# Patient Record
Sex: Female | Born: 1980
Health system: Southern US, Community
[De-identification: ages and names within clinical notes are randomized; demographics above are authoritative.]

## PROBLEM LIST (undated history)

## (undated) DIAGNOSIS — K5090 Crohn's disease, unspecified, without complications: Secondary | ICD-10-CM

## (undated) HISTORY — PX: COLPOSCOPY: SHX161

## (undated) HISTORY — PX: RIGHT OOPHORECTOMY: SHX2359

## (undated) HISTORY — PX: REVISION OF TOTAL SHOULDER: SUR1277

## (undated) HISTORY — PX: TONSILLECTOMY: SUR1361

## (undated) HISTORY — PX: SHOULDER ARTHROSCOPY: SHX128

## (undated) HISTORY — PX: TUBAL LIGATION: SHX77

## (undated) HISTORY — PX: NASAL SEPTUM SURGERY: SHX37

---

## 2015-07-15 DIAGNOSIS — J0120 Acute ethmoidal sinusitis, unspecified: Secondary | ICD-10-CM | POA: Diagnosis not present

## 2015-08-17 ENCOUNTER — Emergency Department (HOSPITAL_COMMUNITY): Payer: Medicare Other

## 2015-08-17 ENCOUNTER — Encounter (HOSPITAL_COMMUNITY): Payer: Self-pay | Admitting: *Deleted

## 2015-08-17 ENCOUNTER — Emergency Department (HOSPITAL_COMMUNITY)
Admission: EM | Admit: 2015-08-17 | Discharge: 2015-08-17 | Disposition: A | Discharge status: Home / Self Care | Payer: Medicare Other | Attending: Emergency Medicine | Admitting: Emergency Medicine

## 2015-08-17 DIAGNOSIS — Y9389 Activity, other specified: Secondary | ICD-10-CM | POA: Diagnosis not present

## 2015-08-17 DIAGNOSIS — M79671 Pain in right foot: Secondary | ICD-10-CM | POA: Diagnosis not present

## 2015-08-17 DIAGNOSIS — S99921A Unspecified injury of right foot, initial encounter: Secondary | ICD-10-CM | POA: Diagnosis not present

## 2015-08-17 DIAGNOSIS — Y998 Other external cause status: Secondary | ICD-10-CM | POA: Diagnosis not present

## 2015-08-17 DIAGNOSIS — Z880 Allergy status to penicillin: Secondary | ICD-10-CM | POA: Diagnosis not present

## 2015-08-17 DIAGNOSIS — Z8719 Personal history of other diseases of the digestive system: Secondary | ICD-10-CM | POA: Diagnosis not present

## 2015-08-17 DIAGNOSIS — W208XXA Other cause of strike by thrown, projected or falling object, initial encounter: Secondary | ICD-10-CM | POA: Insufficient documentation

## 2015-08-17 DIAGNOSIS — Y9289 Other specified places as the place of occurrence of the external cause: Secondary | ICD-10-CM | POA: Insufficient documentation

## 2015-08-17 DIAGNOSIS — M79674 Pain in right toe(s): Secondary | ICD-10-CM | POA: Diagnosis not present

## 2015-08-17 DIAGNOSIS — K5090 Crohn's disease, unspecified, without complications: Secondary | ICD-10-CM | POA: Insufficient documentation

## 2015-08-17 HISTORY — DX: Crohn's disease, unspecified, without complications: K50.90

## 2015-08-17 MED ORDER — TRAMADOL HCL 50 MG PO TABS
100.0000 mg | ORAL_TABLET | Freq: Once | ORAL | Status: AC
  Administered 2015-08-17: 100 mg via ORAL
  Filled 2015-08-17: qty 2

## 2015-08-17 MED ORDER — NAPROXEN 500 MG PO TABS: 500.0000 mg | ORAL_TABLET | Freq: Two times a day (BID) | ORAL | Status: DC

## 2015-08-17 NOTE — ED Notes (Signed)
Pt placed into shorts 

## 2015-08-17 NOTE — ED Notes (Signed)
Pt reports dropping a 20lbs weight on RT foot on Friday AM..  Pt reports pain to rt foot while walking and standing. Foot is red at base of Rt great toe.

## 2015-08-17 NOTE — ED Provider Notes (Signed)
CSN: [id number]     Arrival date & time 08/17/15  1648 History   By signing my name below, I, Evon Slack, attest that this documentation has been prepared under the direction and in the presence of [employer], PA-C. Electronically Signed: Evon Slack, ED Scribe. 08/17/2015. 5:39 PM.     Chief Complaint  Patient presents with  . Foot Pain   The history is provided by the patient. No language interpreter was used.   HPI Comments: Chelsea Barnes is a 35 y.o. female who presents to the Emergency Department complaining of right foot injury onset 1 day prior. Pt states that she dropped a 20 pound weight on the top of her foot. She states that she has some associated swelling. Pt states that the pain is worse when bearing weight and ambulating. Pt states that she was wearing "flats" when the wight dropped on her foot. Pt states that she tried tylenol with no relief. Pt denies numbness or tingling. Denies that she is pregnant or breast feeding.  Past Medical History  Diagnosis Date  . Crohn disease [facility])    Past Surgical History  Procedure Laterality Date  . Nasal septum surgery    . Shoulder arthroscopy    . Revision of total shoulder Right   . Tonsillectomy    . Colposcopy      with laser  . Cesarean section    . Tubal ligation    . Right oophorectomy     History reviewed. No pertinent family history. Social History  Substance Use Topics  . Smoking status: Never Smoker   . Smokeless tobacco: Never Used  . Alcohol Use: No   OB History    No data available     Review of Systems  Musculoskeletal: Positive for joint swelling and arthralgias. Negative for gait problem.  Neurological: Negative for numbness.     Allergies  Penicillins  Home Medications   Prior to Admission medications   Medication Sig Start Date End Date Taking? Authorizing Provider  naproxen (NAPROSYN) 500 MG tablet Take 1 tablet (500 mg total) by mouth 2 (two) times daily. 08/17/15   Raekwan Spelman  Patel-Mills, PA-C   BP 110/66 mmHg  Pulse 67  Temp(Src) 97.8 F (36.6 C) (Oral)  Resp 18  Ht  (1.753 m)  Wt 122.925 kg  BMI 40.00 kg/m2  SpO2 98%  LMP 08/14/2015   Physical Exam  Constitutional: She is oriented to person, place, and time. She appears well-developed and well-nourished. No distress.  HENT:  Head: Normocephalic and atraumatic.  Eyes: Conjunctivae and EOM are normal.  Neck: Neck supple. No tracheal deviation present.  Cardiovascular: Normal rate.   Pulmonary/Chest: Effort normal. No respiratory distress.  Musculoskeletal: Normal range of motion.  Right foot: Able to flex and extend toes. 2+ DP pulse. Tenderness to palpation of the lateral and dorsal aspect of the distal phalanx. No erythema or ecchymosis.  Neurological: She is alert and oriented to person, place, and time.  Skin: Skin is warm and dry.  Psychiatric: She has a normal mood and affect. Her behavior is normal.  Nursing note and vitals reviewed.   ED Course  Procedures (including critical care time) DIAGNOSTIC STUDIES: Oxygen Saturation is 99% on RA, normal by my interpretation.    COORDINATION OF CARE: 5:39 PM-Discussed treatment plan with pt at bedside and pt agreed to plan.     Labs Review Labs Reviewed - No data to display  Imaging Review Dg Toe Great Right  08/17/2015  CLINICAL DATA:  Patient with great toe pain after dropping weight on the foot. Initial encounter. EXAM: RIGHT GREAT TOE COMPARISON:  None. FINDINGS: There is no evidence of fracture or dislocation. There is no evidence of arthropathy or other focal bone abnormality. Soft tissues are unremarkable. IMPRESSION: Negative. Electronically Signed   By: Annia Belt M.D.   On: 08/17/2015 18:32      EKG Interpretation None      MDM   Final diagnoses:  Right foot pain   Patient presents for first right toe pain after dropping a 20 pound weight on it yesterday after working out. She has mild tenderness along the lateral and  worse last bite of the toe. No obvious signs of swelling or ecchymosis. X-ray is negative for acute fracture. I discussed applying ice. I also explained that she should wear proper shoes. She was given naproxen for pain. Follow-up was also discussed within 1 week if pain persists. Patient agrees with plan. Filed Vitals:   08/17/15 1704 08/17/15 1848  BP: 121/61 110/66  Pulse: 64 67  Temp: 97.7 F (36.5 C) 97.8 F (36.6 C)  Resp: 17 18   Medications  traMADol (ULTRAM) tablet 100 mg (100 mg Oral Given 08/17/15 1826)   I personally performed the services described in this documentation, which was scribed in my presence. The recorded information has been reviewed and is accurate.      Catha Gosselin, PA-C 08/17/15 1919  Gwyneth Sprout, MD 08/19/15 [phone]

## 2015-11-11 DIAGNOSIS — R5383 Other fatigue: Secondary | ICD-10-CM | POA: Diagnosis not present

## 2015-11-11 DIAGNOSIS — L659 Nonscarring hair loss, unspecified: Secondary | ICD-10-CM | POA: Diagnosis not present

## 2015-11-11 DIAGNOSIS — M2550 Pain in unspecified joint: Secondary | ICD-10-CM | POA: Diagnosis not present

## 2015-11-11 DIAGNOSIS — K5090 Crohn's disease, unspecified, without complications: Secondary | ICD-10-CM | POA: Diagnosis not present

## 2015-12-11 DIAGNOSIS — K529 Noninfective gastroenteritis and colitis, unspecified: Secondary | ICD-10-CM | POA: Diagnosis not present

## 2015-12-16 DIAGNOSIS — M2550 Pain in unspecified joint: Secondary | ICD-10-CM | POA: Diagnosis not present

## 2015-12-16 DIAGNOSIS — Z8269 Family history of other diseases of the musculoskeletal system and connective tissue: Secondary | ICD-10-CM | POA: Diagnosis not present

## 2015-12-16 DIAGNOSIS — M533 Sacrococcygeal disorders, not elsewhere classified: Secondary | ICD-10-CM | POA: Diagnosis not present

## 2015-12-16 DIAGNOSIS — R5382 Chronic fatigue, unspecified: Secondary | ICD-10-CM | POA: Diagnosis not present

## 2015-12-16 DIAGNOSIS — K5010 Crohn's disease of large intestine without complications: Secondary | ICD-10-CM | POA: Diagnosis not present

## 2015-12-25 DIAGNOSIS — K529 Noninfective gastroenteritis and colitis, unspecified: Secondary | ICD-10-CM | POA: Diagnosis not present

## 2015-12-25 DIAGNOSIS — R12 Heartburn: Secondary | ICD-10-CM | POA: Diagnosis not present

## 2016-03-10 DIAGNOSIS — N898 Other specified noninflammatory disorders of vagina: Secondary | ICD-10-CM | POA: Diagnosis not present

## 2016-03-10 DIAGNOSIS — N760 Acute vaginitis: Secondary | ICD-10-CM | POA: Diagnosis not present

## 2016-10-27 DIAGNOSIS — R509 Fever, unspecified: Secondary | ICD-10-CM | POA: Diagnosis not present

## 2016-11-11 DIAGNOSIS — R591 Generalized enlarged lymph nodes: Secondary | ICD-10-CM | POA: Diagnosis not present

## 2016-11-12 ENCOUNTER — Other Ambulatory Visit: Payer: Self-pay | Admitting: Family Medicine

## 2016-11-13 ENCOUNTER — Other Ambulatory Visit: Payer: Self-pay | Admitting: Family Medicine

## 2016-11-13 DIAGNOSIS — R591 Generalized enlarged lymph nodes: Secondary | ICD-10-CM

## 2016-11-17 ENCOUNTER — Ambulatory Visit
Admission: RE | Admit: 2016-11-17 | Discharge: 2016-11-17 | Disposition: A | Discharge status: Home / Self Care | Payer: Medicare Other | Source: Ambulatory Visit | Attending: Family Medicine | Admitting: Family Medicine

## 2016-11-17 DIAGNOSIS — R591 Generalized enlarged lymph nodes: Secondary | ICD-10-CM

## 2016-11-17 DIAGNOSIS — R599 Enlarged lymph nodes, unspecified: Secondary | ICD-10-CM | POA: Diagnosis not present

## 2016-11-24 DIAGNOSIS — D72829 Elevated white blood cell count, unspecified: Secondary | ICD-10-CM | POA: Diagnosis not present

## 2017-02-21 ENCOUNTER — Emergency Department
Admission: EM | Admit: 2017-02-21 | Discharge: 2017-02-21 | Disposition: A | Discharge status: Home / Self Care | Payer: Medicare Other | Attending: Emergency Medicine | Admitting: Emergency Medicine

## 2017-02-21 ENCOUNTER — Emergency Department: Payer: Medicare Other

## 2017-02-21 DIAGNOSIS — M79605 Pain in left leg: Secondary | ICD-10-CM | POA: Diagnosis present

## 2017-02-21 DIAGNOSIS — W2203XA Walked into furniture, initial encounter: Secondary | ICD-10-CM | POA: Diagnosis not present

## 2017-02-21 DIAGNOSIS — S8012XA Contusion of left lower leg, initial encounter: Secondary | ICD-10-CM | POA: Diagnosis not present

## 2017-02-21 DIAGNOSIS — Y9301 Activity, walking, marching and hiking: Secondary | ICD-10-CM | POA: Insufficient documentation

## 2017-02-21 DIAGNOSIS — Y929 Unspecified place or not applicable: Secondary | ICD-10-CM | POA: Diagnosis not present

## 2017-02-21 DIAGNOSIS — Y998 Other external cause status: Secondary | ICD-10-CM | POA: Insufficient documentation

## 2017-02-21 DIAGNOSIS — M79669 Pain in unspecified lower leg: Secondary | ICD-10-CM | POA: Diagnosis not present

## 2017-02-21 LAB — BASIC METABOLIC PANEL
Anion gap: 8 (ref 5–15)
BUN: 22 mg/dL — ABNORMAL HIGH (ref 6–20)
CALCIUM: 9.2 mg/dL (ref 8.9–10.3)
CO2: 26 mmol/L (ref 22–32)
Chloride: 106 mmol/L (ref 101–111)
Creatinine, Ser: 0.8 mg/dL (ref 0.44–1.00)
GFR calc non Af Amer: >60 mL/min (ref >60)
GLUCOSE: 94 mg/dL (ref 65–99)
Potassium: 4.3 mmol/L (ref 3.5–5.1)
Sodium: 140 mmol/L (ref 135–145)

## 2017-02-21 NOTE — ED Notes (Signed)
Patient transported to Ultrasound 

## 2017-02-21 NOTE — ED Triage Notes (Signed)
Pt reports that she fell in the rain hitting her left lower leg on the step in July, pt states that now the area on her left leg is tender and she has pain in her left calf and up into the back of her knee

## 2017-02-21 NOTE — ED Notes (Addendum)
Pt has left leg pain.  Pt states she fell last month and leg is not any better with pain.   No deformity noted.  No swelling noted.  Pt alert.

## 2017-02-21 NOTE — ED Provider Notes (Signed)
[facility] Emergency Department Provider Note  ____________________________________________   First MD Initiated Contact with Patient 02/21/17 1932     (approximate)  I have reviewed the triage vital signs and the nursing notes.   HISTORY  Chief Complaint Leg Pain    HPI Chelsea Barnes is a 36 y.o. female who self presents to the emergency department with 1 month of painful swelling to the anterior aspect of her left leg began shortly after striking her leg against a piece of furniture. She has noted moderate severity pain worse when touching improved when not touching. She does have a personal and family history of von Willebranddisease. She has never had a deep vein thrombus or pulmonary embolus in. She denies chest pressures of breath. She denies numbness or weakness. She denies recent travel or immobilization. She denies OCP use.   Past Medical History:  Diagnosis Date  . Crohn disease [facility])     Patient Active Problem List   Diagnosis Date Noted  . Crohn disease [facility])     Past Surgical History:  Procedure Laterality Date  . CESAREAN SECTION    . COLPOSCOPY     with laser  . NASAL SEPTUM SURGERY    . REVISION OF TOTAL SHOULDER Right   . RIGHT OOPHORECTOMY    . SHOULDER ARTHROSCOPY    . TONSILLECTOMY    . TUBAL LIGATION      Prior to Admission medications   Medication Sig Start Date End Date Taking? Authorizing Provider  naproxen (NAPROSYN) 500 MG tablet Take 1 tablet (500 mg total) by mouth 2 (two) times daily. 08/17/15   Patel-Mills, Lorelle Formosa, PA-C    Allergies Ivp dye [iodinated diagnostic agents] and Penicillins  No family history on file.  Social History Social History  Substance Use Topics  . Smoking status: Never Smoker  . Smokeless tobacco: Never Used  . Alcohol use No    Review of Systems Constitutional: No fever/chills Eyes: No visual changes. ENT: No sore throat. Cardiovascular: Denies chest pain. Respiratory:  Denies shortness of breath. Gastrointestinal: No abdominal pain.  No nausea, no vomiting.  No diarrhea.  No constipation. Genitourinary: Negative for dysuria. Musculoskeletal: Negative for back pain. Skin: Negative for rash. Neurological: Negative for headaches, focal weakness or numbness.   ____________________________________________   PHYSICAL EXAM:  VITAL SIGNS: ED Triage Vitals  Enc Vitals Group     BP 02/21/17 1748 (!) 123/55     Pulse Rate 02/21/17 1748 67     Resp 02/21/17 1748 18     Temp 02/21/17 1748 98.6 F (37 C)     Temp Source 02/21/17 1748 Oral     SpO2 02/21/17 1748 98 %     Weight 02/21/17 1749 265 lb (120.2 kg)     Height 02/21/17 1749 5\' 8"  (1.727 m)     Head Circumference --      Peak Flow --      Pain Score 02/21/17 1748 8     Pain Loc --      Pain Edu? --      Excl. in GC? --     Constitutional: Alert and oriented 4 well appearing nontoxic no diaphoresis speaks full clear sentences Eyes: PERRL EOMI. Head: Atraumatic. Nose: No congestion/rhinnorhea. Mouth/Throat: No trismus Neck: No stridor.   Cardiovascular: Normal rate, regular rhythm. Grossly normal heart sounds.  Good peripheral circulation. Respiratory: Normal respiratory effort.  No retractions. Lungs CTAB and moving good air Gastrointestinal: Obese soft nontender Musculoskeletal: No lower  extremity edema. Focal area of induration and tenderness on the anterior aspect of her left shin with no erythema or warmth to posterior cells. His pulse compartments are soft Neurologic:  Normal speech and language. No gross focal neurologic deficits are appreciated. Skin:  Skin is warm, dry and intact. No rash noted. Psychiatric: Mood and affect are normal. Speech and behavior are normal.    ____________________________________________   DIFFERENTIAL includes but not limited to  Exam thrombus, contusion, hematoma, cellulitis, abscess ____________________________________________   LABS (all labs  ordered are listed, but only abnormal results are displayed)  Labs Reviewed  BASIC METABOLIC PANEL - Abnormal; Notable for the following:       Result Value   BUN 22 (*)    All other components within normal limits    BASIC metabolic panel normal __________________________________________  EKG   ____________________________________________  RADIOLOGY  Ultrasound with no acute disease noted ____________________________________________   PROCEDURES  Procedure(s) performed: no  Procedures  Critical Care performed: no  Observation: no ____________________________________________   INITIAL IMPRESSION / ASSESSMENT AND PLAN / ED COURSE  Pertinent labs & imaging results that were available during my care of the patient were reviewed by me and considered in my medical decision making (see chart for details).  The patient arrives hemodynamically stable and well appearing. She has strong pulses and is neurovascularly intact. Fortunately her ultrasound is negative for acute DVT. She does have posttraumatic pain in the setting of von Willebrand's disease she may actually have a resolving hematoma versus a contusion. I've advised her to use nonsteroidals as well as ice and to follow up with her primary care physician. She is discharged home in good condition.      ____________________________________________   FINAL CLINICAL IMPRESSION(S) / ED DIAGNOSES  Final diagnoses:  Contusion of left lower leg, initial encounter      NEW MEDICATIONS STARTED DURING THIS VISIT:  Discharge Medication List as of 02/21/2017  9:35 PM       Note:  This document was prepared using Dragon voice recognition software and may include unintentional dictation errors.     Merrily Brittle, MD 02/22/17 1429

## 2017-02-21 NOTE — Discharge Instructions (Signed)
Fortunately today your ultrasound was negative for blood clot. Please use ibuprofen and ice as needed for discomfort and follow up with her primary care physician as needed. Return to the emergency department for any concerns.  It was a pleasure to take care of you today, and thank you for coming to our emergency department.  If you have any questions or concerns before leaving please ask the nurse to grab me and I'm more than happy to go through your aftercare instructions again.  If you were prescribed any opioid pain medication today such as Norco, Vicodin, Percocet, morphine, hydrocodone, or oxycodone please make sure you do not drive when you are taking this medication as it can alter your ability to drive safely.  If you have any concerns once you are home that you are not improving or are in fact getting worse before you can make it to your follow-up appointment, please do not hesitate to call 911 and come back for further evaluation.  Merrily Brittle, MD  Results for orders placed or performed during the hospital encounter of 02/21/17  Basic metabolic panel  Result Value Ref Range   Sodium 140 135 - 145 mmol/L   Potassium 4.3 3.5 - 5.1 mmol/L   Chloride 106 101 - 111 mmol/L   CO2 26 22 - 32 mmol/L   Glucose, Bld 94 65 - 99 mg/dL   BUN 22 (H) 6 - 20 mg/dL   Creatinine, Ser 6.96 0.44 - 1.00 mg/dL   Calcium 9.2 8.9 - 29.5 mg/dL   GFR calc non Af Amer >60 >60 mL/min   GFR calc Af Amer >60 >60 mL/min   Anion gap 8 5 - 15   US Venous Img Lower Unilateral Left  Result Date: 02/21/2017 CLINICAL DATA:  Initial evaluation for acute calf pain. EXAM: Left LOWER EXTREMITY VENOUS DOPPLER ULTRASOUND TECHNIQUE: Gray-scale sonography with graded compression, as well as color Doppler and duplex ultrasound were performed to evaluate the lower extremity deep venous systems from the level of the common femoral vein and including the common femoral, femoral, profunda femoral, popliteal and calf veins  including the posterior tibial, peroneal and gastrocnemius veins when visible. The superficial great saphenous vein was also interrogated. Spectral Doppler was utilized to evaluate flow at rest and with distal augmentation maneuvers in the common femoral, femoral and popliteal veins. COMPARISON:  None. FINDINGS: Contralateral Common Femoral Vein: Respiratory phasicity is normal and symmetric with the symptomatic side. No evidence of thrombus. Normal compressibility. Common Femoral Vein: No evidence of thrombus. Normal compressibility, respiratory phasicity and response to augmentation. Saphenofemoral Junction: No evidence of thrombus. Normal compressibility and flow on color Doppler imaging. Profunda Femoral Vein: No evidence of thrombus. Normal compressibility and flow on color Doppler imaging. Femoral Vein: No evidence of thrombus. Normal compressibility, respiratory phasicity and response to augmentation. Popliteal Vein: No evidence of thrombus. Normal compressibility, respiratory phasicity and response to augmentation. Calf Veins: No evidence of thrombus. Normal compressibility and flow on color Doppler imaging. Superficial Great Saphenous Vein: No evidence of thrombus. Normal compressibility and flow on color Doppler imaging. Venous Reflux:  None. Other Findings:  None. IMPRESSION: No evidence of DVT within the left lower extremity. Electronically Signed   By: Rise Mu M.D.   On: 02/21/2017 20:47

## 2017-02-21 NOTE — ED Notes (Signed)
D/w Dr. Roxan Hockey, new order for US of the left leg.

## 2017-02-21 NOTE — ED Notes (Signed)
FIRST NURSE NOTE:  Pt states she thinks she has a blood clot in her left leg, states she is having pain behind her knee and her foot is throbbing. Pt has possible Von Wildebrand's disease

## 2017-05-27 DIAGNOSIS — H53123 Transient visual loss, bilateral: Secondary | ICD-10-CM | POA: Diagnosis not present

## 2017-05-27 DIAGNOSIS — R600 Localized edema: Secondary | ICD-10-CM | POA: Diagnosis not present

## 2017-06-17 ENCOUNTER — Other Ambulatory Visit: Payer: Self-pay

## 2017-06-17 ENCOUNTER — Emergency Department (HOSPITAL_COMMUNITY)
Admission: EM | Admit: 2017-06-17 | Discharge: 2017-06-17 | Disposition: A | Discharge status: Home / Self Care | Payer: Medicare Other | Attending: Emergency Medicine | Admitting: Emergency Medicine

## 2017-06-17 ENCOUNTER — Encounter (HOSPITAL_COMMUNITY): Payer: Self-pay | Admitting: Nurse Practitioner

## 2017-06-17 ENCOUNTER — Emergency Department (HOSPITAL_COMMUNITY): Payer: Medicare Other

## 2017-06-17 DIAGNOSIS — H539 Unspecified visual disturbance: Secondary | ICD-10-CM

## 2017-06-17 DIAGNOSIS — H53132 Sudden visual loss, left eye: Secondary | ICD-10-CM | POA: Diagnosis present

## 2017-06-17 DIAGNOSIS — R519 Headache, unspecified: Secondary | ICD-10-CM

## 2017-06-17 DIAGNOSIS — R51 Headache: Secondary | ICD-10-CM | POA: Insufficient documentation

## 2017-06-17 DIAGNOSIS — R42 Dizziness and giddiness: Secondary | ICD-10-CM | POA: Diagnosis not present

## 2017-06-17 DIAGNOSIS — H538 Other visual disturbances: Secondary | ICD-10-CM | POA: Diagnosis not present

## 2017-06-17 LAB — CBC
HCT: 40.1 % (ref 36.0–46.0)
HEMOGLOBIN: 14.1 g/dL (ref 12.0–15.0)
MCH: 32.7 pg (ref 26.0–34.0)
MCHC: 35.2 g/dL (ref 30.0–36.0)
MCV: 93 fL (ref 78.0–100.0)
Platelets: 259 10*3/uL (ref 150–400)
RBC: 4.31 MIL/uL (ref 3.87–5.11)
RDW: 12.2 % (ref 11.5–15.5)
WBC: 4.9 10*3/uL (ref 4.0–10.5)

## 2017-06-17 LAB — URINALYSIS, ROUTINE W REFLEX MICROSCOPIC
Bilirubin Urine: NEGATIVE
GLUCOSE, UA: NEGATIVE mg/dL
Ketones, ur: NEGATIVE mg/dL
NITRITE: NEGATIVE
Protein, ur: NEGATIVE mg/dL
SPECIFIC GRAVITY, URINE: 1.025 (ref 1.005–1.030)
pH: 5 (ref 5.0–8.0)

## 2017-06-17 LAB — BASIC METABOLIC PANEL
ANION GAP: 4 — AB (ref 5–15)
BUN: 14 mg/dL (ref 6–20)
CALCIUM: 9.3 mg/dL (ref 8.9–10.3)
CO2: 27 mmol/L (ref 22–32)
CREATININE: 0.76 mg/dL (ref 0.44–1.00)
Chloride: 107 mmol/L (ref 101–111)
GFR calc Af Amer: >60 mL/min (ref >60)
GLUCOSE: 98 mg/dL (ref 65–99)
Potassium: 3.6 mmol/L (ref 3.5–5.1)
Sodium: 138 mmol/L (ref 135–145)

## 2017-06-17 LAB — TSH: TSH: 6.871 u[IU]/mL — AB (ref 0.350–4.500)

## 2017-06-17 LAB — I-STAT BETA HCG BLOOD, ED (MC, WL, AP ONLY): I-stat hCG, quantitative: <5 m[IU]/mL (ref <5)

## 2017-06-17 MED ORDER — LORAZEPAM 2 MG/ML IJ SOLN
1.0000 mg | Freq: Once | INTRAMUSCULAR | Status: AC
  Administered 2017-06-17: 1 mg via INTRAVENOUS
  Filled 2017-06-17: qty 1

## 2017-06-17 MED ORDER — PROCHLORPERAZINE EDISYLATE 5 MG/ML IJ SOLN
10.0000 mg | Freq: Once | INTRAMUSCULAR | Status: AC
  Administered 2017-06-17: 10 mg via INTRAVENOUS
  Filled 2017-06-17: qty 2

## 2017-06-17 MED ORDER — GADOBENATE DIMEGLUMINE 529 MG/ML IV SOLN
20.0000 mL | Freq: Once | INTRAVENOUS | Status: AC | PRN
  Administered 2017-06-17: 20 mL via INTRAVENOUS

## 2017-06-17 MED ORDER — KETOROLAC TROMETHAMINE 30 MG/ML IJ SOLN
30.0000 mg | Freq: Once | INTRAMUSCULAR | Status: AC
Start: 2017-06-17 — End: 2017-06-17
  Administered 2017-06-17: 30 mg via INTRAVENOUS
  Filled 2017-06-17: qty 1

## 2017-06-17 MED ORDER — DIPHENHYDRAMINE HCL 50 MG/ML IJ SOLN
25.0000 mg | Freq: Once | INTRAMUSCULAR | Status: AC
  Administered 2017-06-17: 25 mg via INTRAVENOUS
  Filled 2017-06-17: qty 1

## 2017-06-17 NOTE — ED Notes (Signed)
Patient transported to MRI 

## 2017-06-17 NOTE — ED Notes (Signed)
Patient returned from MRI.

## 2017-06-17 NOTE — ED Triage Notes (Signed)
Patient presents with c/o head pain in back of head and when she wakes up feels like everything is black, pressure in head and behind eyes. Feels like she almost passed out this morning however she was able to drive herself here from work. Patient reports going to urgent care 2 weeks ago and they scheduled her to see the eye doctor which is scheduled in January. Reports this has been going on intermittent since October. The blindness sensation only happens in the morning. Denies any fever, chills, chest pain. Patient is ambulatory and able to talk in complete sentences. Patient reports pain on side of head is 10/10 and tender to touch. Denies any falls.

## 2017-06-17 NOTE — ED Notes (Signed)
ED Provider at bedside. 

## 2017-06-17 NOTE — ED Notes (Signed)
Pt is in MRI  

## 2017-06-18 ENCOUNTER — Encounter: Payer: Self-pay | Admitting: Neurology
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
## 2017-09-03 ENCOUNTER — Other Ambulatory Visit: Payer: Medicare Other

## 2017-09-03 ENCOUNTER — Encounter: Payer: Self-pay | Admitting: Neurology

## 2017-09-03 ENCOUNTER — Other Ambulatory Visit: Payer: Self-pay

## 2017-09-03 ENCOUNTER — Ambulatory Visit: Payer: Medicare Other | Admitting: Neurology

## 2017-09-03 VITALS — BP 108/60 | HR 54 | Ht 68.0 in | Wt 297.0 lb

## 2017-09-03 DIAGNOSIS — G43109 Migraine with aura, not intractable, without status migrainosus: Secondary | ICD-10-CM | POA: Diagnosis not present

## 2017-09-03 DIAGNOSIS — M7989 Other specified soft tissue disorders: Secondary | ICD-10-CM

## 2017-09-03 DIAGNOSIS — H539 Unspecified visual disturbance: Secondary | ICD-10-CM

## 2017-09-03 MED ORDER — TOPIRAMATE 25 MG PO TABS: ORAL_TABLET | ORAL | 6 refills | Status: DC
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
## 2017-09-03 NOTE — Patient Instructions (Addendum)
1. Bloodwork for CBC, CMP, ESR, CRP, TSH  Your provider has requested that you have labwork completed today. Please go to [facility] ([id number]) on the second floor of this building before leaving the office today. You do not need to check in. If you are not called within 15 minutes please check with the front desk.   2. Schedule echocardiogram 3. Start Topamax 30m: Take 1 tablet at bedtime for 1 week, then increase to 2 tablets at night 4. Try doing different maneuvers in the morning, drink a glass of water before getting out of bed. Slowly get out of bed, sit up first and do leg stretching exercises, then stand up and do marching in place. Once you start feeling the vision changes, check your blood pressure. Increase fluid intake during the daytime 5. Follow-up in 3-4 months, call for any changes

## 2017-09-04 LAB — COMPLETE METABOLIC PANEL WITH GFR
AG Ratio: 1.6 (calc) (ref 1.0–2.5)
ALT: 22 U/L (ref 6–29)
AST: 20 U/L (ref 10–30)
Albumin: 4.3 g/dL (ref 3.6–5.1)
Alkaline phosphatase (APISO): 48 U/L (ref 33–115)
BUN: 13 mg/dL (ref 7–25)
CO2: 23 mmol/L (ref 20–32)
CREATININE: 0.69 mg/dL (ref 0.50–1.10)
Calcium: 9.5 mg/dL (ref 8.6–10.2)
Chloride: 105 mmol/L (ref 98–110)
GFR, Est African American: 132 mL/min/{1.73_m2} (ref >=60)
GFR, Est Non African American: 114 mL/min/{1.73_m2} (ref >=60)
GLOBULIN: 2.7 g/dL (ref 1.9–3.7)
Glucose, Bld: 90 mg/dL (ref 65–99)
POTASSIUM: 4.3 mmol/L (ref 3.5–5.3)
SODIUM: 139 mmol/L (ref 135–146)
Total Bilirubin: 0.4 mg/dL (ref 0.2–1.2)
Total Protein: 7 g/dL (ref 6.1–8.1)

## 2017-09-04 LAB — TSH: TSH: 2.12 m[IU]/L

## 2017-09-04 LAB — CBC
HCT: 41.3 % (ref 35.0–45.0)
Hemoglobin: 14.5 g/dL (ref 11.7–15.5)
MCH: 32.1 pg (ref 27.0–33.0)
MCHC: 35.1 g/dL (ref 32.0–36.0)
MCV: 91.4 fL (ref 80.0–100.0)
MPV: 10.7 fL (ref 7.5–12.5)
PLATELETS: 288 10*3/uL (ref 140–400)
RBC: 4.52 10*6/uL (ref 3.80–5.10)
RDW: 12.4 % (ref 11.0–15.0)
WBC: 4.3 10*3/uL (ref 3.8–10.8)

## 2017-09-04 LAB — SEDIMENTATION RATE: SED RATE: 14 mm/h (ref 0–20)

## 2017-09-04 LAB — C-REACTIVE PROTEIN: CRP: 6.7 mg/L (ref <8.0)

## 2017-09-09 ENCOUNTER — Telehealth: Payer: Self-pay

## 2017-09-09 NOTE — Telephone Encounter (Signed)
LMOM relaying message below.  

## 2017-09-09 NOTE — Telephone Encounter (Signed)
-----   Message from Van Clines, MD sent at 09/07/2017  9:46 AM EST ----- Pls let her know the bloodwork was normal, thyroid and inflammatory markers are normal. Thanks

## 2017-09-13 ENCOUNTER — Other Ambulatory Visit (HOSPITAL_COMMUNITY): Payer: Medicare Other

## 2017-09-14 ENCOUNTER — Other Ambulatory Visit (HOSPITAL_COMMUNITY): Payer: Medicare Other

## 2017-09-15 ENCOUNTER — Ambulatory Visit (HOSPITAL_COMMUNITY): Payer: 59 | Attending: Cardiology

## 2017-09-15 ENCOUNTER — Other Ambulatory Visit: Payer: Self-pay

## 2017-09-15 DIAGNOSIS — H539 Unspecified visual disturbance: Secondary | ICD-10-CM | POA: Insufficient documentation

## 2017-09-15 DIAGNOSIS — M7989 Other specified soft tissue disorders: Secondary | ICD-10-CM | POA: Diagnosis not present

## 2017-09-15 DIAGNOSIS — G459 Transient cerebral ischemic attack, unspecified: Secondary | ICD-10-CM | POA: Insufficient documentation

## 2017-09-15 DIAGNOSIS — G43109 Migraine with aura, not intractable, without status migrainosus: Secondary | ICD-10-CM | POA: Diagnosis not present

## 2017-09-16 ENCOUNTER — Telehealth: Payer: Self-pay

## 2017-09-16 NOTE — Telephone Encounter (Signed)
-----   Message from Van Clines, MD sent at 09/16/2017  8:45 AM EST ----- Pls let her know echocardiogram showed normal heart function, thanks

## 2017-09-16 NOTE — Telephone Encounter (Signed)
Called pt.  No answer.  Voicemail box full.  Unable to relay message below.  

## 2017-11-26 ENCOUNTER — Encounter: Payer: Self-pay | Admitting: Neurology

## 2017-11-26 ENCOUNTER — Ambulatory Visit (INDEPENDENT_AMBULATORY_CARE_PROVIDER_SITE_OTHER): Payer: 59 | Admitting: Neurology

## 2017-11-26 ENCOUNTER — Other Ambulatory Visit: Payer: Self-pay

## 2017-11-26 VITALS — BP 126/78 | HR 85 | Ht 68.0 in | Wt 297.0 lb

## 2017-11-26 DIAGNOSIS — H539 Unspecified visual disturbance: Secondary | ICD-10-CM

## 2017-11-26 DIAGNOSIS — G43109 Migraine with aura, not intractable, without status migrainosus: Secondary | ICD-10-CM

## 2017-11-26 MED ORDER — TOPIRAMATE 50 MG PO TABS: 50.0000 mg | ORAL_TABLET | Freq: Two times a day (BID) | ORAL | 6 refills | Status: AC
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
## 2017-11-26 NOTE — Patient Instructions (Addendum)
1. Increase Topamax to 50mg  twice a day 2. Minimize over the counter pain medication to 2-3 a week. May try Aleve instead when headaches are really bad 3. Continue to monitor BP, discuss with Dr. Hyman Hopes 4. Follow-up in 6 months, call for any changes

## 2017-12-03 ENCOUNTER — Encounter: Payer: Self-pay | Admitting: Neurology

## 2018-06-16 DIAGNOSIS — M545 Low back pain: Secondary | ICD-10-CM | POA: Diagnosis not present

## 2018-06-16 DIAGNOSIS — Z880 Allergy status to penicillin: Secondary | ICD-10-CM | POA: Diagnosis not present

## 2018-06-16 DIAGNOSIS — Z79899 Other long term (current) drug therapy: Secondary | ICD-10-CM | POA: Diagnosis not present

## 2018-06-16 DIAGNOSIS — Z91041 Radiographic dye allergy status: Secondary | ICD-10-CM | POA: Diagnosis not present

## 2018-06-16 DIAGNOSIS — R000 Tachycardia, unspecified: Secondary | ICD-10-CM | POA: Diagnosis not present

## 2018-06-29 ENCOUNTER — Ambulatory Visit: Payer: 59 | Admitting: Neurology

## 2018-12-06 IMAGING — MR MR MRA NECK WO/W CM
11 of 16 series · 28 of 48 positions shown · IV contrast (multihance)
Comparison: Brain imaging same day.  Intracranial MR angiography.

CLINICAL DATA: Acute onset of headache and pressure.  Near syncope.

EXAM:
MRA NECK WITHOUT AND WITH CONTRAST
TECHNIQUE: Multiplanar and multiecho pulse sequences of the neck were obtained
without and with intravenous contrast. Angiographic images of the
neck were obtained using MRA technique without and with intravenous
contrast.
CONTRAST:  20mL MULTIHANCE GADOBENATE DIMEGLUMINE 529 MG/ML IV SOLN

[Series 3: DWI · axial · 3.0mm · 1.09mm/px · z∈[-21,+135]mm · 5 of 106 slices shown (1 of 4)]
[im 1/106]
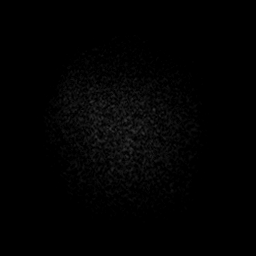
[im 27/106]
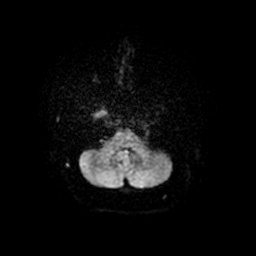
[im 53/106]
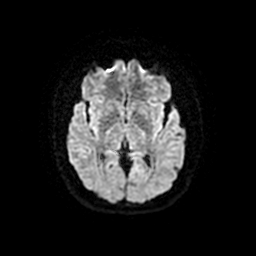
[im 79/106]
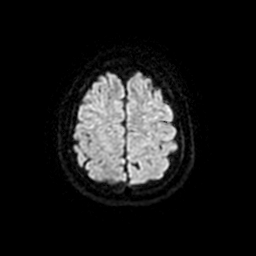
[im 106/106]
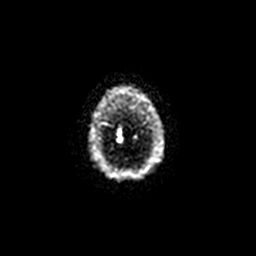

[Series 4: T1 · sagittal · 5.0mm · 0.47mm/px · 1 of 24 slices shown]
[im 1/24]
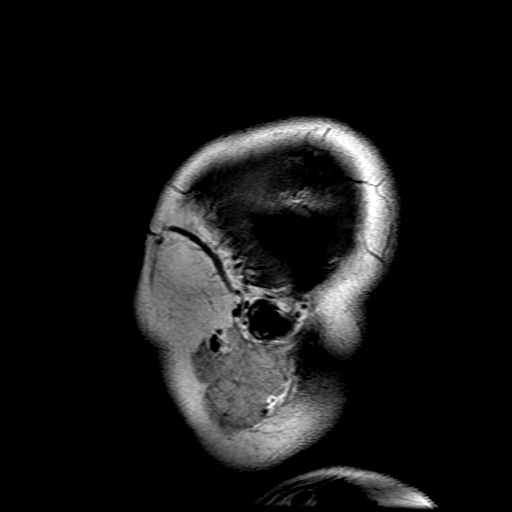

[Series 5: DWI · coronal · 5.0mm · 1.09mm/px · 3 of 64 slices shown (2 of 4)]
[im 1/64]
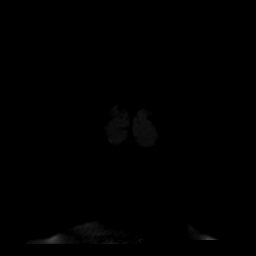
[im 32/64]
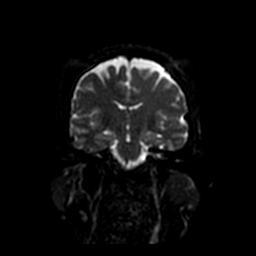
[im 64/64]
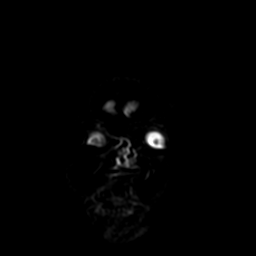

[Series 6: T2 · axial · 5.0mm · 0.43mm/px · 1 of 27 slices shown (1 of 2)]
[im 1/27]
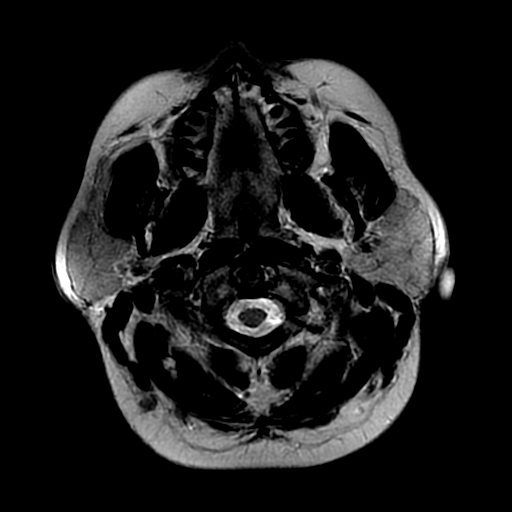

[Series 7: (id) mt fs · axial · 1.4mm · 0.39mm/px · z∈[-13,+126]mm · 7 of 138 slices shown]
[im 1/138]
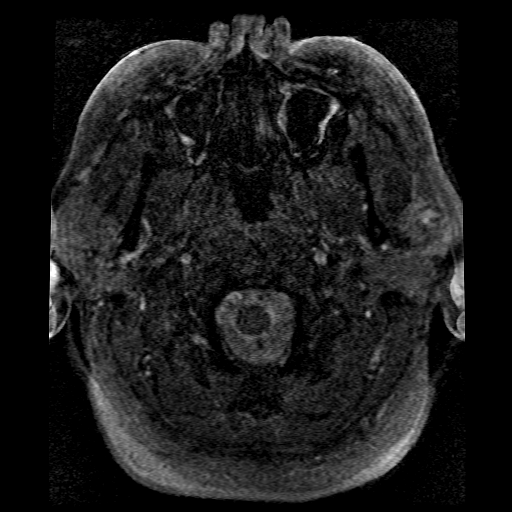
[im 23/138]
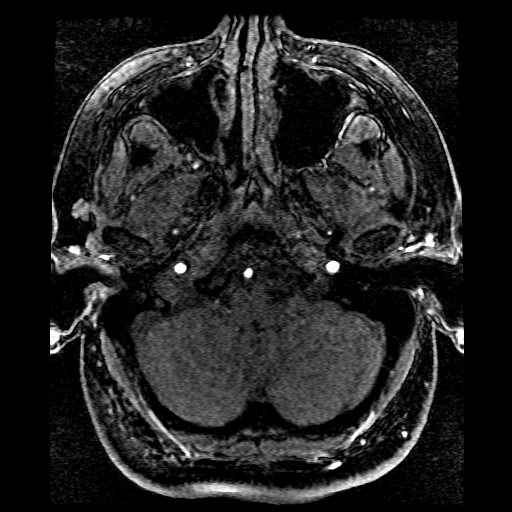
[im 46/138]
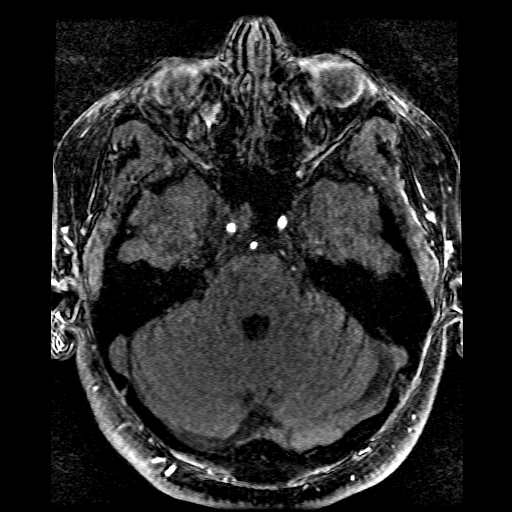
[im 69/138]
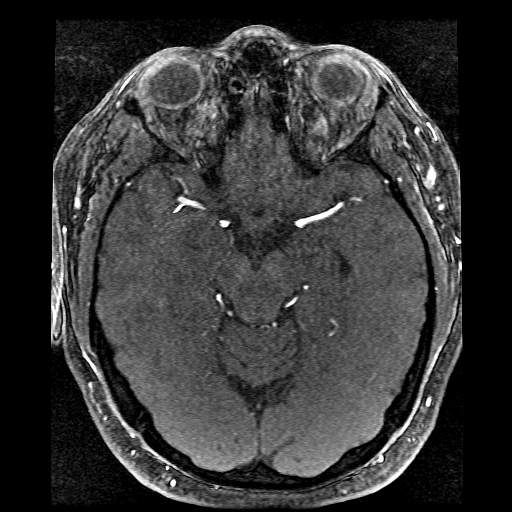
[im 92/138]
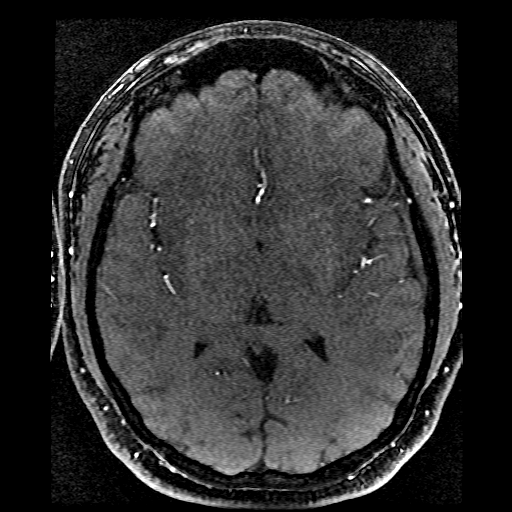
[im 115/138]
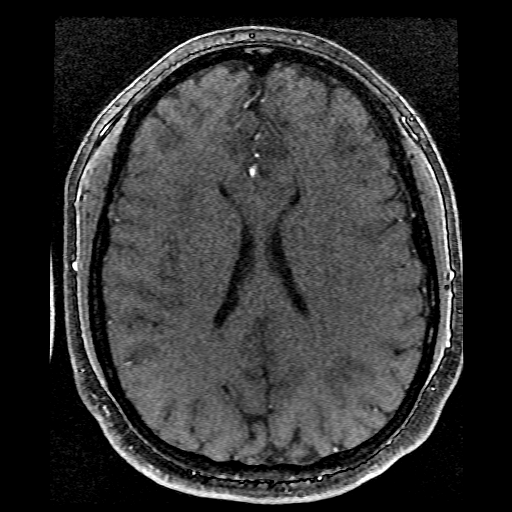
[im 138/138]
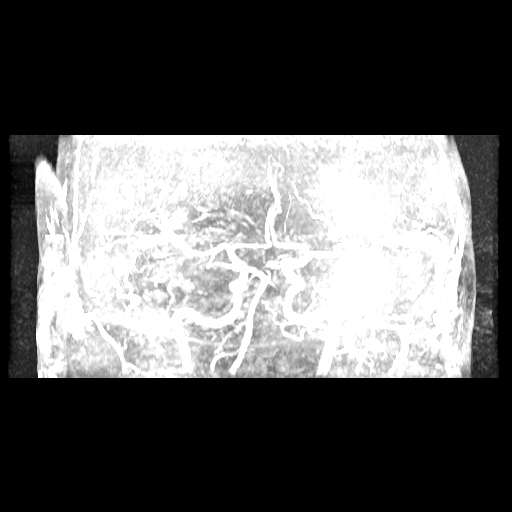

[Series 8: FLAIR · axial · 5.0mm · 0.43mm/px · 1 of 27 slices shown]
[im 1/27]
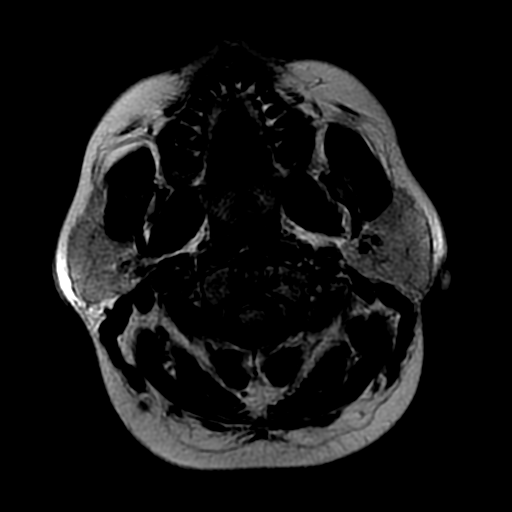

[Series 9: ax mpgr · axial · 5.0mm · 0.43mm/px · 1 of 25 slices shown]
[im 1/25]
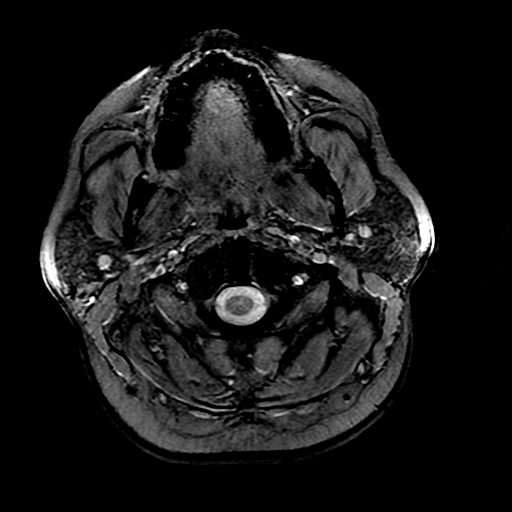

[Series 10: ax fspgr irp · axial · 3.0mm · 0.47mm/px · z∈[-38,+127]mm · 3 of 56 slices shown]
[im 1/56]
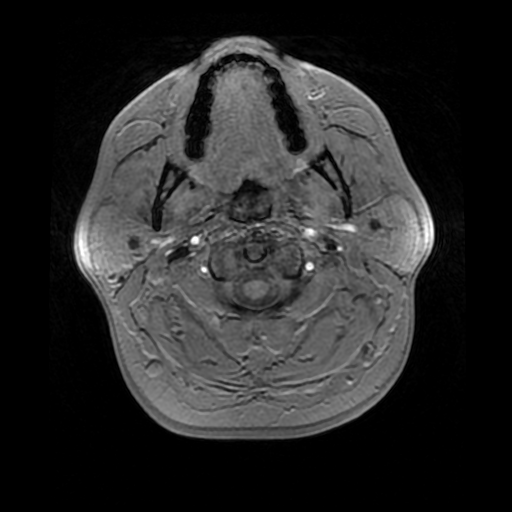
[im 28/56]
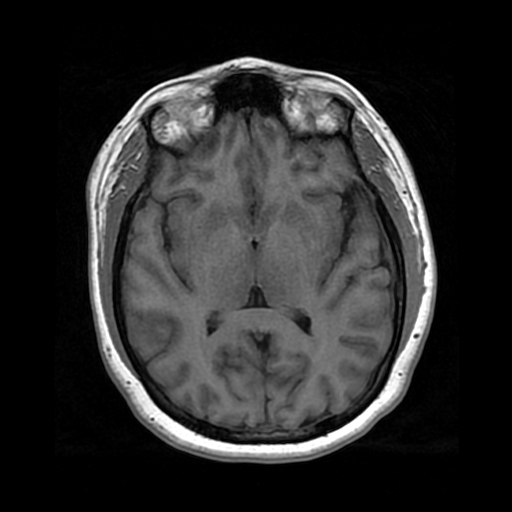
[im 56/56]
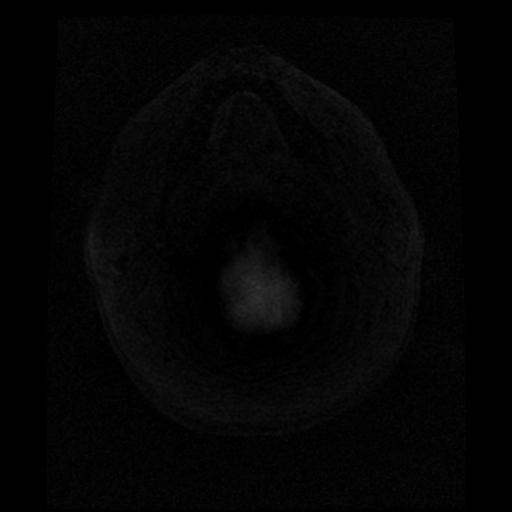

[Series 11: T2 · coronal · 5.0mm · 0.45mm/px · 1 of 26 slices shown (2 of 2)]
[im 1/26]
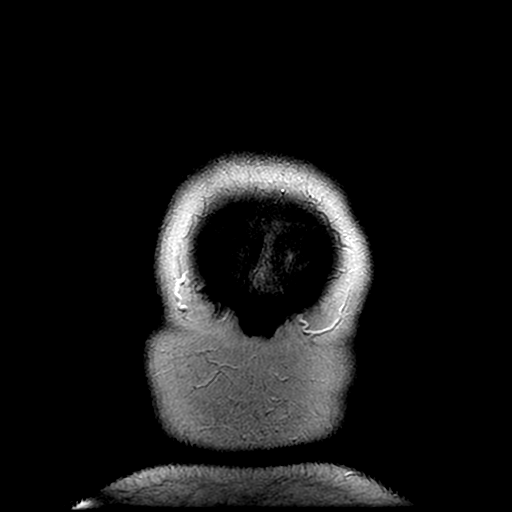

[Series 300: DWI · axial · 3.0mm · 1.09mm/px · z∈[-21,+135]mm · 3 of 53 slices shown (3 of 4)]
[im 1/53]
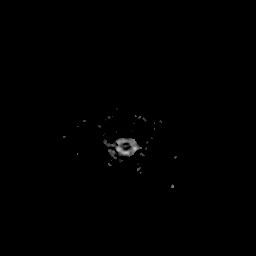
[im 27/53]
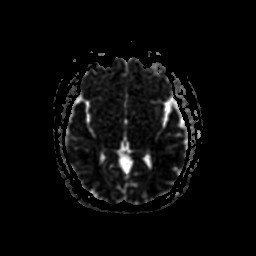
[im 53/53]
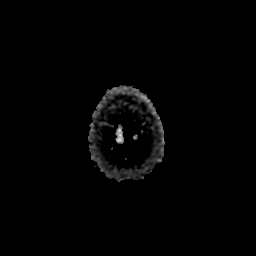

[Series 500: DWI · coronal · 5.0mm · 1.09mm/px · 2 of 32 slices shown (4 of 4)]
[im 1/32]
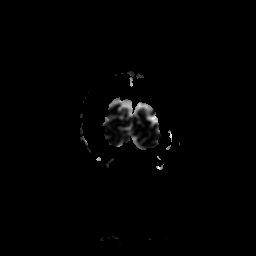
[im 32/32]
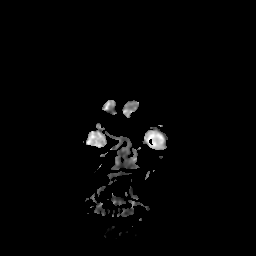

[28 of 48 positions shown; findings below may reference images not displayed]

FINDINGS: Branching pattern of the brachiocephalic vessels from the arch is
normal. No origin stenoses. Both common carotid arteries are widely
patent to the bifurcation. Both carotid bifurcations are normal.
Both cervical internal carotid arteries are normal.

Both vertebral arteries are widely patent at the origins and through
the cervical region to the basilar.
IMPRESSION: Normal MR angiography of the neck vessels. No evidence of dissection
or other acquired vascular pathology.

## 2019-08-03 DIAGNOSIS — M9901 Segmental and somatic dysfunction of cervical region: Secondary | ICD-10-CM | POA: Diagnosis not present

## 2019-08-03 DIAGNOSIS — M9905 Segmental and somatic dysfunction of pelvic region: Secondary | ICD-10-CM | POA: Diagnosis not present

## 2019-08-03 DIAGNOSIS — M47811 Spondylosis without myelopathy or radiculopathy, occipito-atlanto-axial region: Secondary | ICD-10-CM | POA: Diagnosis not present

## 2019-08-03 DIAGNOSIS — M47818 Spondylosis without myelopathy or radiculopathy, sacral and sacrococcygeal region: Secondary | ICD-10-CM | POA: Diagnosis not present

## 2019-08-10 DIAGNOSIS — M9901 Segmental and somatic dysfunction of cervical region: Secondary | ICD-10-CM | POA: Diagnosis not present

## 2019-08-10 DIAGNOSIS — M47811 Spondylosis without myelopathy or radiculopathy, occipito-atlanto-axial region: Secondary | ICD-10-CM | POA: Diagnosis not present

## 2019-08-10 DIAGNOSIS — M9905 Segmental and somatic dysfunction of pelvic region: Secondary | ICD-10-CM | POA: Diagnosis not present

## 2019-08-10 DIAGNOSIS — M47818 Spondylosis without myelopathy or radiculopathy, sacral and sacrococcygeal region: Secondary | ICD-10-CM | POA: Diagnosis not present

## 2019-09-15 DIAGNOSIS — G8929 Other chronic pain: Secondary | ICD-10-CM | POA: Diagnosis not present

## 2019-09-15 DIAGNOSIS — R799 Abnormal finding of blood chemistry, unspecified: Secondary | ICD-10-CM | POA: Diagnosis not present

## 2019-09-15 DIAGNOSIS — M545 Low back pain: Secondary | ICD-10-CM | POA: Diagnosis not present

## 2019-09-15 DIAGNOSIS — M533 Sacrococcygeal disorders, not elsewhere classified: Secondary | ICD-10-CM | POA: Diagnosis not present

## 2019-09-25 DIAGNOSIS — E538 Deficiency of other specified B group vitamins: Secondary | ICD-10-CM | POA: Diagnosis not present

## 2019-09-25 DIAGNOSIS — R5383 Other fatigue: Secondary | ICD-10-CM | POA: Diagnosis not present

## 2019-09-25 DIAGNOSIS — Z136 Encounter for screening for cardiovascular disorders: Secondary | ICD-10-CM | POA: Diagnosis not present

## 2019-09-25 DIAGNOSIS — M545 Low back pain: Secondary | ICD-10-CM | POA: Diagnosis not present

## 2019-09-25 DIAGNOSIS — L659 Nonscarring hair loss, unspecified: Secondary | ICD-10-CM | POA: Diagnosis not present

## 2019-09-25 DIAGNOSIS — M5416 Radiculopathy, lumbar region: Secondary | ICD-10-CM | POA: Diagnosis not present

## 2019-09-25 DIAGNOSIS — M2550 Pain in unspecified joint: Secondary | ICD-10-CM | POA: Diagnosis not present

## 2019-09-26 DIAGNOSIS — E538 Deficiency of other specified B group vitamins: Secondary | ICD-10-CM | POA: Diagnosis not present

## 2019-09-26 DIAGNOSIS — R5383 Other fatigue: Secondary | ICD-10-CM | POA: Diagnosis not present

## 2019-09-26 DIAGNOSIS — M79605 Pain in left leg: Secondary | ICD-10-CM | POA: Diagnosis not present

## 2019-09-26 DIAGNOSIS — M545 Low back pain: Secondary | ICD-10-CM | POA: Diagnosis not present

## 2019-09-26 DIAGNOSIS — M419 Scoliosis, unspecified: Secondary | ICD-10-CM | POA: Diagnosis not present

## 2019-09-26 DIAGNOSIS — Z136 Encounter for screening for cardiovascular disorders: Secondary | ICD-10-CM | POA: Diagnosis not present

## 2019-09-26 DIAGNOSIS — D682 Hereditary deficiency of other clotting factors: Secondary | ICD-10-CM | POA: Diagnosis not present

## 2019-09-26 DIAGNOSIS — D680 Von Willebrand's disease: Secondary | ICD-10-CM | POA: Diagnosis not present

## 2019-09-26 DIAGNOSIS — L659 Nonscarring hair loss, unspecified: Secondary | ICD-10-CM | POA: Diagnosis not present

## 2019-09-26 DIAGNOSIS — M2550 Pain in unspecified joint: Secondary | ICD-10-CM | POA: Diagnosis not present

## 2019-10-05 DIAGNOSIS — M25552 Pain in left hip: Secondary | ICD-10-CM | POA: Diagnosis not present

## 2019-10-05 DIAGNOSIS — G8929 Other chronic pain: Secondary | ICD-10-CM | POA: Diagnosis not present

## 2019-10-05 DIAGNOSIS — M545 Low back pain: Secondary | ICD-10-CM | POA: Diagnosis not present

## 2019-10-05 DIAGNOSIS — M533 Sacrococcygeal disorders, not elsewhere classified: Secondary | ICD-10-CM | POA: Diagnosis not present

## 2019-10-09 DIAGNOSIS — Z6841 Body Mass Index (BMI) 40.0 and over, adult: Secondary | ICD-10-CM | POA: Diagnosis not present

## 2019-10-09 DIAGNOSIS — K921 Melena: Secondary | ICD-10-CM | POA: Diagnosis not present

## 2019-10-09 DIAGNOSIS — R197 Diarrhea, unspecified: Secondary | ICD-10-CM | POA: Diagnosis not present

## 2019-10-09 DIAGNOSIS — R21 Rash and other nonspecific skin eruption: Secondary | ICD-10-CM | POA: Diagnosis not present

## 2019-10-09 DIAGNOSIS — K5900 Constipation, unspecified: Secondary | ICD-10-CM | POA: Diagnosis not present

## 2019-10-09 DIAGNOSIS — M545 Low back pain: Secondary | ICD-10-CM | POA: Diagnosis not present

## 2019-10-09 DIAGNOSIS — G43809 Other migraine, not intractable, without status migrainosus: Secondary | ICD-10-CM | POA: Diagnosis not present

## 2019-10-09 DIAGNOSIS — R768 Other specified abnormal immunological findings in serum: Secondary | ICD-10-CM | POA: Diagnosis not present

## 2019-10-16 DIAGNOSIS — K921 Melena: Secondary | ICD-10-CM | POA: Diagnosis not present

## 2019-10-16 DIAGNOSIS — R1030 Lower abdominal pain, unspecified: Secondary | ICD-10-CM | POA: Diagnosis not present

## 2019-10-16 DIAGNOSIS — R194 Change in bowel habit: Secondary | ICD-10-CM | POA: Diagnosis not present

## 2019-10-16 DIAGNOSIS — R1310 Dysphagia, unspecified: Secondary | ICD-10-CM | POA: Diagnosis not present

## 2019-10-16 DIAGNOSIS — K219 Gastro-esophageal reflux disease without esophagitis: Secondary | ICD-10-CM | POA: Diagnosis not present

## 2019-10-16 DIAGNOSIS — R768 Other specified abnormal immunological findings in serum: Secondary | ICD-10-CM | POA: Diagnosis not present

## 2019-10-16 DIAGNOSIS — R197 Diarrhea, unspecified: Secondary | ICD-10-CM | POA: Diagnosis not present

## 2019-10-19 DIAGNOSIS — M545 Low back pain: Secondary | ICD-10-CM | POA: Diagnosis not present

## 2019-10-30 DIAGNOSIS — B349 Viral infection, unspecified: Secondary | ICD-10-CM | POA: Diagnosis not present

## 2019-10-30 DIAGNOSIS — Z20822 Contact with and (suspected) exposure to covid-19: Secondary | ICD-10-CM | POA: Diagnosis not present

## 2019-11-01 DIAGNOSIS — R062 Wheezing: Secondary | ICD-10-CM | POA: Diagnosis not present

## 2019-11-01 DIAGNOSIS — R05 Cough: Secondary | ICD-10-CM | POA: Diagnosis not present

## 2019-11-01 DIAGNOSIS — R112 Nausea with vomiting, unspecified: Secondary | ICD-10-CM | POA: Diagnosis not present

## 2019-11-01 DIAGNOSIS — B9721 SARS-associated coronavirus as the cause of diseases classified elsewhere: Secondary | ICD-10-CM | POA: Diagnosis not present

## 2019-11-08 DIAGNOSIS — K921 Melena: Secondary | ICD-10-CM | POA: Diagnosis not present

## 2019-11-08 DIAGNOSIS — E538 Deficiency of other specified B group vitamins: Secondary | ICD-10-CM | POA: Diagnosis not present

## 2019-11-08 DIAGNOSIS — R768 Other specified abnormal immunological findings in serum: Secondary | ICD-10-CM | POA: Diagnosis not present
